# Patient Record
Sex: Female | Born: 1975 | Race: Black or African American | Hispanic: No | Marital: Married | Smoking: Never smoker
Health system: Southern US, Community
[De-identification: ages and names within clinical notes are randomized; demographics above are authoritative.]

## PROBLEM LIST (undated history)

## (undated) DIAGNOSIS — L309 Dermatitis, unspecified: Secondary | ICD-10-CM

## (undated) DIAGNOSIS — Z889 Allergy status to unspecified drugs, medicaments and biological substances status: Secondary | ICD-10-CM

---

## 2015-10-03 ENCOUNTER — Encounter: Payer: Self-pay | Admitting: *Deleted

## 2015-10-03 ENCOUNTER — Emergency Department
Admission: EM | Admit: 2015-10-03 | Discharge: 2015-10-04 | Disposition: A | Discharge status: Home / Self Care | Attending: Emergency Medicine | Admitting: Emergency Medicine

## 2015-10-03 DIAGNOSIS — X58XXXA Exposure to other specified factors, initial encounter: Secondary | ICD-10-CM | POA: Insufficient documentation

## 2015-10-03 DIAGNOSIS — Z880 Allergy status to penicillin: Secondary | ICD-10-CM | POA: Insufficient documentation

## 2015-10-03 DIAGNOSIS — T781XXA Other adverse food reactions, not elsewhere classified, initial encounter: Secondary | ICD-10-CM | POA: Diagnosis not present

## 2015-10-03 DIAGNOSIS — Y9289 Other specified places as the place of occurrence of the external cause: Secondary | ICD-10-CM | POA: Insufficient documentation

## 2015-10-03 DIAGNOSIS — Y9389 Activity, other specified: Secondary | ICD-10-CM | POA: Insufficient documentation

## 2015-10-03 DIAGNOSIS — Y998 Other external cause status: Secondary | ICD-10-CM | POA: Insufficient documentation

## 2015-10-03 DIAGNOSIS — R05 Cough: Secondary | ICD-10-CM | POA: Diagnosis not present

## 2015-10-03 DIAGNOSIS — L500 Allergic urticaria: Secondary | ICD-10-CM | POA: Insufficient documentation

## 2015-10-03 DIAGNOSIS — Z3202 Encounter for pregnancy test, result negative: Secondary | ICD-10-CM | POA: Insufficient documentation

## 2015-10-03 DIAGNOSIS — Z79899 Other long term (current) drug therapy: Secondary | ICD-10-CM | POA: Diagnosis not present

## 2015-10-03 DIAGNOSIS — T7840XA Allergy, unspecified, initial encounter: Secondary | ICD-10-CM

## 2015-10-03 HISTORY — DX: Dermatitis, unspecified: L30.9

## 2015-10-03 LAB — URINALYSIS COMPLETE WITH MICROSCOPIC (ARMC ONLY)
Bilirubin Urine: NEGATIVE
Glucose, UA: NEGATIVE mg/dL
KETONES UR: NEGATIVE mg/dL
Leukocytes, UA: NEGATIVE
NITRITE: NEGATIVE
PH: 7 (ref 5.0–8.0)
PROTEIN: NEGATIVE mg/dL
Specific Gravity, Urine: 1.003 — ABNORMAL LOW (ref 1.005–1.030)
WBC, UA: NONE SEEN WBC/hpf (ref 0–5)

## 2015-10-03 LAB — POCT PREGNANCY, URINE: PREG TEST UR: NEGATIVE

## 2015-10-03 MED ORDER — EPINEPHRINE 0.3 MG/0.3ML IJ SOAJ
0.3000 mg | Freq: Once | INTRAMUSCULAR | Status: AC
  Administered 2015-10-03: 0.3 mg via INTRAMUSCULAR
  Filled 2015-10-03: qty 0.3

## 2015-10-03 MED ORDER — METHYLPREDNISOLONE SODIUM SUCC 125 MG IJ SOLR
125.0000 mg | Freq: Once | INTRAMUSCULAR | Status: AC
  Administered 2015-10-03: 125 mg via INTRAVENOUS
  Filled 2015-10-03: qty 2

## 2015-10-03 MED ORDER — EPINEPHRINE 0.3 MG/0.3ML IJ SOAJ: 0.3000 mg | Freq: Once | INTRAMUSCULAR | Status: AC

## 2015-10-03 MED ORDER — PREDNISONE 20 MG PO TABS: 40.0000 mg | ORAL_TABLET | Freq: Every day | ORAL | Status: DC

## 2015-10-03 MED ORDER — SODIUM CHLORIDE 0.9 % IV BOLUS (SEPSIS)
1000.0000 mL | Freq: Once | INTRAVENOUS | Status: AC
  Administered 2015-10-03: 1000 mL via INTRAVENOUS

## 2015-10-03 NOTE — ED Notes (Signed)
Pt states she ate at [facility], began having difficulty breathing and uncontrolled coughing. Pt states she started having hives at 1900, took benadryl otc 25 mg, repeated benadryl at 1900 when face began swelling and cough persisted. Pt presents w/ continued cough, hives, and angioedema.

## 2015-10-03 NOTE — ED Notes (Addendum)
Pt says she's having an allergic reaction but unsure to what; says she took  Benadryl at 8pm when symptoms started; has worsened since then; swelling around mouth; pt constantly clearing her throat or coughing; says she is having difficulty breathing; charge nurse aware and will take pt to treatment room for triage/assessment

## 2015-10-03 NOTE — Discharge Instructions (Signed)
Please seek medical attention for any high fevers, chest pain, shortness of breath, change in behavior, persistent vomiting, bloody stool or any other new or concerning symptoms. ° ° °Allergies °An allergy is an abnormal reaction to a substance by the body's defense system (immune system). Allergies can develop at any age. °WHAT CAUSES ALLERGIES? °An allergic reaction happens when the immune system mistakenly reacts to a normally harmless substance, called an allergen, as if it were harmful. The immune system releases antibodies to fight the substance. Antibodies eventually release a chemical called histamine into the bloodstream. The release of histamine is meant to protect the body from infection, but it also causes discomfort. °An allergic reaction can be triggered by: °· Eating an allergen. °· Inhaling an allergen. °· Touching an allergen. °WHAT TYPES OF ALLERGIES ARE THERE? °There are many types of allergies. Common types include: °· Seasonal allergies. People with this type of allergy are usually allergic to substances that are only present during certain seasons, such as molds and pollens. °· Food allergies. °· Drug allergies. °· Insect allergies. °· Animal dander allergies. °WHAT ARE SYMPTOMS OF ALLERGIES? °Possible allergy symptoms include: °· Swelling of the lips, face, tongue, mouth, or throat. °· Sneezing, coughing, or wheezing. °· Nasal congestion. °· Tingling in the mouth. °· Rash. °· Itching. °· Itchy, red, swollen areas of skin (hives). °· Watery eyes. °· Vomiting. °· Diarrhea. °· Dizziness. °· Lightheadedness. °· Fainting. °· Trouble breathing or swallowing. °· Chest tightness. °· Rapid heartbeat. °HOW ARE ALLERGIES DIAGNOSED? °Allergies are diagnosed with a medical and family history and one or more of the following: °· Skin tests. °· Blood tests. °· A food diary. A food diary is a record of all the foods and drinks you have in a day and of all the symptoms you experience. °· The results of an  elimination diet. An elimination diet involves eliminating foods from your diet and then adding them back in one by one to find out if a certain food causes an allergic reaction. °HOW ARE ALLERGIES TREATED? °There is no cure for allergies, but allergic reactions can be treated with medicine. Severe reactions usually need to be treated at a hospital. °HOW CAN REACTIONS BE PREVENTED? °The best way to prevent an allergic reaction is by avoiding the substance you are allergic to. Allergy shots and medicines can also help prevent reactions in some cases. People with severe allergic reactions may be able to prevent a life-threatening reaction called anaphylaxis with a medicine given right after exposure to the allergen. °  °This information is not intended to replace advice given to you by your health care provider. Make sure you discuss any questions you have with your health care provider. °  °Document Released: 10/25/2002 Document Revised: 08/22/2014 Document Reviewed: 05/13/2014 °Elsevier Interactive Patient Education ©2016 [employer]. ° °

## 2015-10-04 NOTE — ED Provider Notes (Signed)
-----------------------------------------   2:13 AM on 10/04/2015 -----------------------------------------   Blood pressure 94/45, pulse 76, temperature 98.6 F (37 C), temperature source Oral, resp. rate 16, height  (1.676 m), weight 170 lb (77.111 kg), last menstrual period 09/25/2015, SpO2 100 %.  Assuming care from Dr. Derrill Kay.  In short, Christy Roberts is a 40 y.o. female with a chief complaint of Allergic Reaction .  Refer to the original H&P for additional details.  The current plan of care is to reassess the patient.  The patient was here for 4 hours and she did not have any resurgence of her symptoms. She'll be discharged home to follow-up with her primary care physician.   Rebecka Apley, MD 10/04/15 [phone]

## 2015-10-09 NOTE — ED Provider Notes (Signed)
[facility] Emergency Department Provider Note    ____________________________________________  Time seen: ~2215  I have reviewed the triage vital signs and the nursing notes.   HISTORY  Chief Complaint Allergic Reaction   History limited by: Not Limited   HPI Christy Roberts is a 40 y.o. female who presents to the emergency department today because of concern for allergic reaction. The patient states that she ate lunch at [employer] today. Ever since that time she has felt somewhat off although no symptoms that she clearly took 4 and allergic reaction. She then ate some spaghetti tonight and started feeling like she was having full body itching. She did notice some rash. She additionally felt some upset stomach and had some swelling around her mouth. His symptoms then progressed. She states she has never had symptoms like this before. No known allergies.No new detergents or soaps.     Past Medical History  Diagnosis Date  . Eczema     There are no active problems to display for this patient.   History reviewed. No pertinent past surgical history.  Current Outpatient Rx  Name  Route  Sig  Dispense  Refill  . Multiple Vitamins-Minerals (MULTIVITAMIN ADULT PO)   Oral   Take by mouth.         . Probiotic Product (PROBIOTIC DAILY PO)   Oral   Take by mouth.         . EPINEPHrine (EPIPEN 2-PAK) 0.3 mg/0.3 mL IJ SOAJ injection   Intramuscular   Inject 0.3 mLs (0.3 mg total) into the muscle once.   1 Device   1   . predniSONE (DELTASONE) 20 MG tablet   Oral   Take 2 tablets (40 mg total) by mouth daily.   6 tablet   0     Allergies Penicillins  History reviewed. No pertinent family history.  Social History Social History  Substance Use Topics  . Smoking status: Never Smoker   . Smokeless tobacco: Never Used  . Alcohol Use: No    Review of Systems  Constitutional: Negative for fever. Cardiovascular: Negative for chest  pain. Respiratory: Negative for shortness of breath. Positive for cough Gastrointestinal: Positive for abdominal discomfort Neurological: Negative for headaches, focal weakness or numbness.   10-point ROS otherwise negative.  ____________________________________________   PHYSICAL EXAM:  VITAL SIGNS: ED Triage Vitals  Enc Vitals Group     BP 10/03/15 2221 124/76 mmHg     Pulse Rate 10/03/15 2221 71     Resp 10/03/15 2221 25     Temp 10/03/15 2221 98.6 F (37 C)     Temp Source 10/03/15 2221 Oral     SpO2 10/03/15 2221 97 %     Weight 10/03/15 2221 170 lb (77.111 kg)     Height 10/03/15 2221  (1.676 m)   Constitutional: Alert and oriented. Appears uncomfortable. Occasional dry cough. Eyes: Conjunctivae are normal. PERRL. Normal extraocular movements. ENT   Head: Normocephalic and atraumatic.   Nose: No congestion/rhinnorhea.   Mouth/Throat: Mucous membranes are moist.   Neck: No stridor. Hematological/Lymphatic/Immunilogical: No cervical lymphadenopathy. Cardiovascular: Normal rate, regular rhythm.  No murmurs, rubs, or gallops. Respiratory: Normal respiratory effort without tachypnea nor retractions. Breath sounds are clear and equal bilaterally. No wheezes/rales/rhonchi. Occasional dry cough. Gastrointestinal: Soft and nontender. No distention.  Genitourinary: Deferred Musculoskeletal: Normal range of motion in all extremities. No joint effusions.  No lower extremity tenderness nor edema. Neurologic:  Normal speech and language. No gross focal neurologic  deficits are appreciated.  Skin: Hives noted especially in the antecubital fossa. Psychiatric: Mood and affect are normal. Speech and behavior are normal. Patient exhibits appropriate insight and judgment.  ____________________________________________    LABS (pertinent positives/negatives)  Labs Reviewed  URINALYSIS COMPLETEWITH MICROSCOPIC ([facility] ONLY) - Abnormal; Notable for the following:     Color, Urine COLORLESS (*)    APPearance CLEAR (*)    Specific Gravity, Urine 1.003 (*)    Hgb urine dipstick 1+ (*)    Bacteria, UA RARE (*)    Squamous Epithelial / LPF 0-5 (*)    All other components within normal limits  POCT PREGNANCY, URINE     ____________________________________________   EKG  None  ____________________________________________    RADIOLOGY  None  ____________________________________________   PROCEDURES  Procedure(s) performed: None  Critical Care performed: No  ____________________________________________   INITIAL IMPRESSION / ASSESSMENT AND PLAN / ED COURSE  Pertinent labs & imaging results that were available during my care of the patient were reviewed by me and considered in my medical decision making (see chart for details).  Patient presented to the emergency department today after an apparent allergic reaction. No known allergies. Patient is young and did not have any heart disease so given the respiratory issues I did order epi. Patient felt much better after the epi, the cough and itch had resolved. Will continue to monitor in the emergency department.   ____________________________________________   FINAL CLINICAL IMPRESSION(S) / ED DIAGNOSES  Final diagnoses:  Allergic reaction, initial encounter     Phineas Semen, MD 10/09/15 2229

## 2015-10-20 ENCOUNTER — Ambulatory Visit: Payer: Self-pay | Admitting: Allergy and Immunology

## 2016-04-06 ENCOUNTER — Emergency Department (HOSPITAL_COMMUNITY)
Admission: EM | Admit: 2016-04-06 | Discharge: 2016-04-06 | Disposition: A | Discharge status: Home / Self Care | Attending: Emergency Medicine | Admitting: Emergency Medicine

## 2016-04-06 ENCOUNTER — Encounter (HOSPITAL_COMMUNITY): Payer: Self-pay | Admitting: Emergency Medicine

## 2016-04-06 DIAGNOSIS — N3091 Cystitis, unspecified with hematuria: Secondary | ICD-10-CM

## 2016-04-06 DIAGNOSIS — R300 Dysuria: Secondary | ICD-10-CM

## 2016-04-06 DIAGNOSIS — R319 Hematuria, unspecified: Secondary | ICD-10-CM | POA: Diagnosis present

## 2016-04-06 HISTORY — DX: Allergy status to unspecified drugs, medicaments and biological substances: Z88.9

## 2016-04-06 LAB — URINALYSIS, ROUTINE W REFLEX MICROSCOPIC
Bilirubin Urine: NEGATIVE
GLUCOSE, UA: NEGATIVE mg/dL
Ketones, ur: NEGATIVE mg/dL
Nitrite: NEGATIVE
PH: 7 (ref 5.0–8.0)
Protein, ur: 100 mg/dL — AB
SPECIFIC GRAVITY, URINE: 1.015 (ref 1.005–1.030)

## 2016-04-06 LAB — BASIC METABOLIC PANEL WITH GFR
Anion gap: 4 — ABNORMAL LOW (ref 5–15)
BUN: 8 mg/dL (ref 6–20)
CO2: 28 mmol/L (ref 22–32)
Calcium: 9.3 mg/dL (ref 8.9–10.3)
Chloride: 106 mmol/L (ref 101–111)
Creatinine, Ser: 0.95 mg/dL (ref 0.44–1.00)
GFR calc Af Amer: >60 mL/min (ref >60)
GFR calc non Af Amer: >60 mL/min (ref >60)
Glucose, Bld: 102 mg/dL — ABNORMAL HIGH (ref 65–99)
Potassium: 3.8 mmol/L (ref 3.5–5.1)
Sodium: 138 mmol/L (ref 135–145)

## 2016-04-06 LAB — CBC WITH DIFFERENTIAL/PLATELET
Basophils Absolute: 0 K/uL (ref 0.0–0.1)
Basophils Relative: 0 %
Eosinophils Absolute: 0.1 K/uL (ref 0.0–0.7)
Eosinophils Relative: 1 %
HCT: 36.7 % (ref 36.0–46.0)
Hemoglobin: 12.1 g/dL (ref 12.0–15.0)
Lymphocytes Relative: 20 %
Lymphs Abs: 1.6 K/uL (ref 0.7–4.0)
MCH: 27.8 pg (ref 26.0–34.0)
MCHC: 33 g/dL (ref 30.0–36.0)
MCV: 84.4 fL (ref 78.0–100.0)
Monocytes Absolute: 0.5 K/uL (ref 0.1–1.0)
Monocytes Relative: 7 %
Neutro Abs: 5.8 K/uL (ref 1.7–7.7)
Neutrophils Relative %: 72 %
Platelets: 237 K/uL (ref 150–400)
RBC: 4.35 MIL/uL (ref 3.87–5.11)
RDW: 12 % (ref 11.5–15.5)
WBC: 8 K/uL (ref 4.0–10.5)

## 2016-04-06 LAB — URINE MICROSCOPIC-ADD ON
Bacteria, UA: NONE SEEN
Squamous Epithelial / HPF: NONE SEEN

## 2016-04-06 LAB — PREGNANCY, URINE: PREG TEST UR: NEGATIVE

## 2016-04-06 MED ORDER — SULFAMETHOXAZOLE-TRIMETHOPRIM 800-160 MG PO TABS: 1.0000 | ORAL_TABLET | Freq: Two times a day (BID) | ORAL | 0 refills | Status: AC

## 2016-04-06 MED ORDER — NITROFURANTOIN MONOHYD MACRO 100 MG PO CAPS: 100.0000 mg | ORAL_CAPSULE | Freq: Two times a day (BID) | ORAL | 0 refills | Status: AC

## 2016-04-06 MED ORDER — PHENAZOPYRIDINE HCL 200 MG PO TABS: 200.0000 mg | ORAL_TABLET | Freq: Three times a day (TID) | ORAL | 0 refills | Status: AC

## 2016-04-06 NOTE — ED Notes (Signed)
MD at bedside. 

## 2016-04-06 NOTE — ED Provider Notes (Signed)
[facility]-EMERGENCY DEPT Provider Note   CSN: [id number] Arrival date & time: 04/06/16  0547     History   Chief Complaint Chief Complaint  Patient presents with  . Hematuria    HPI Christy Roberts is a 40 y.o. female with no significant pmhx who presents to the ED today c/o hematuria. Pt states that when she woke up this morning she urinated and noticed blood in the toilet. Initially pt thought that she was on her menstrual cycle but she subsequently developed dysuria, urgency and frequency. Pt is now passing clots in her urine. Pt states she has hx of similar and was diagnosed with a UTI. Pt states that she took Macrobid and her symptoms resolved. She denies any vaginal discharge, abdominal pain, fevers, chills, dyspareunia, vomiting, flank pain.  HPI  Past Medical History:  Diagnosis Date  . Eczema   . H/O seasonal allergies     There are no active problems to display for this patient.   History reviewed. No pertinent surgical history.  OB History    No data available       Home Medications    Prior to Admission medications   Medication Sig Start Date End Date Taking? Authorizing Provider  cetirizine (ZYRTEC) 10 MG tablet Take 10 mg by mouth daily.   Yes Historical Provider, MD  EPINEPHrine (EPIPEN 2-PAK) 0.3 mg/0.3 mL IJ SOAJ injection Inject 0.3 mLs (0.3 mg total) into the muscle once. Patient taking differently: Inject 0.3 mg into the muscle daily as needed (allergic reaction).  10/03/15  Yes Phineas SemenGraydon Goodman, MD  levonorgestrel (MIRENA) 20 MCG/24HR IUD 1 each by Intrauterine route once.   Yes Historical Provider, MD    Family History No family history on file.  Social History Social History  Substance Use Topics  . Smoking status: Never Smoker  . Smokeless tobacco: Never Used  . Alcohol use No     Allergies   Penicillins   Review of Systems Review of Systems  All other systems reviewed and are negative.    Physical Exam Updated Vital Signs BP 105/70  (BP Location: Right Arm)   Pulse (!) 57   Temp 97.9 F (36.6 C) (Oral)   Resp 19   Ht 5\' 6"  (1.676 m)   Wt 81.2 kg   LMP 02/27/2016 (Approximate)   SpO2 100%   BMI 28.89 kg/m   Physical Exam  Constitutional: She is oriented to person, place, and time. She appears well-developed and well-nourished. No distress.  HENT:  Head: Normocephalic and atraumatic.  Eyes: Conjunctivae are normal. Right eye exhibits no discharge. Left eye exhibits no discharge. No scleral icterus.  Cardiovascular: Normal rate.   Pulmonary/Chest: Effort normal.  Abdominal: Soft. Bowel sounds are normal. She exhibits no distension and no mass. There is no tenderness. There is no rebound and no guarding. No hernia.  Neurological: She is alert and oriented to person, place, and time. Coordination normal.  Skin: Skin is warm and dry. No rash noted. She is not diaphoretic. No erythema. No pallor.  Psychiatric: She has a normal mood and affect. Her behavior is normal.  Nursing note and vitals reviewed.    ED Treatments / Results  Labs (all labs ordered are listed, but only abnormal results are displayed) Labs Reviewed  BASIC METABOLIC PANEL - Abnormal; Notable for the following:       Result Value   Glucose, Bld 102 (*)    Anion gap 4 (*)    All other components within normal limits  URINALYSIS, ROUTINE W REFLEX MICROSCOPIC (NOT AT [facility]) - Abnormal; Notable for the following:    APPearance CLOUDY (*)    Hgb urine dipstick LARGE (*)    Protein, ur 100 (*)    Leukocytes, UA SMALL (*)    All other components within normal limits  URINE CULTURE  PREGNANCY, URINE  CBC WITH DIFFERENTIAL/PLATELET  URINE MICROSCOPIC-ADD ON    EKG  EKG Interpretation None       Radiology No results found.  Procedures Procedures (including critical care time)  Medications Ordered in ED Medications - No data to display   Initial Impression / Assessment and Plan / ED Course  I have reviewed the triage vital signs  and the nursing notes.  Pertinent labs & imaging results that were available during my care of the patient were reviewed by me and considered in my medical decision making (see chart for details).  Clinical Course   Otherwise healthy 40 year old female presents to the ED today complaining of hematuria, dysuria, urgency and frequency onset today. Patient reports passing clots in her urine. She has history of similar symptoms and was diagnosed with UTI and treated with Macrobid with complete resolution of her symptoms. UA today does show large hemoglobin and small leukocytes. Does not appear to be grossly infected question whether this might be part of her menstrual cycle. However, given the patient is symptomatic we'll send culture and give prescription for Macrobid per pt request. Patient was given additional prescription for Bactrim and instructed to take this instead if symptoms do not improve as Macrobid will not cover for pyelonephritis. Pt also given Pyridium. Recommend follow up with PCP. Return precautions outlined in patient discharge instructions.    Final Clinical Impressions(s) / ED Diagnoses   Final diagnoses:  Dysuria  Hemorrhagic cystitis    New Prescriptions New Prescriptions   No medications on file     Dub MikesSamantha Tripp Dowless, PA-C 04/06/16 1604    Lyndal Pulleyaniel Knott, MD 04/06/16 1759

## 2016-04-06 NOTE — ED Notes (Signed)
Pt has no question upon discharge

## 2016-04-06 NOTE — ED Triage Notes (Signed)
Pt. reports urinary discomfort/dysuria and hematuria onset this morning , denies injury/no fever or chills.

## 2016-04-06 NOTE — Discharge Instructions (Signed)
Take Macrobid and pyridium as prescribed. If your symptoms do not improve or worsen in any way please begin taking Bactrim. Follow up with your primary care provider for re-evaluation if your symptoms do not improve. Return to the ED if you experience severe abdominal pain, inability to urinate, fevers, chills, loss of consciousness, difficulty breathing.

## 2016-04-08 LAB — URINE CULTURE: Culture: 80000 — AB

## 2016-04-09 ENCOUNTER — Telehealth (HOSPITAL_COMMUNITY): Payer: Self-pay

## 2016-04-09 NOTE — Telephone Encounter (Signed)
Post ED Visit - Positive Culture Follow-up  Culture report reviewed by antimicrobial stewardship pharmacist:  []  Enzo BiNathan Batchelder, Pharm.D. []  Celedonio MiyamotoJeremy Frens, Pharm.D., BCPS []  Garvin FilaMike Maccia, Pharm.D. []  Georgina PillionElizabeth Martin, Pharm.D., BCPS []  BerryvilleMinh Pham, [address] Rainbow BoulevardPharm.D., BCPS, AAHIVP []  Estella HuskMichelle Turner, Pharm.D., BCPS, AAHIVP []  Tennis Mustassie Stewart, Pharm.D. []  Sherle Poeob Vincent, Vermont[place]arm.D. Bernie CoveyX  Taylor Stone, Pharm.D.  Positive urine culture, >/= 100,000 colonies -> E Coli Treated with Nitrofurantoin & Sulfa-Trimeth, organism sensitive to the same and no further patient follow-up is required at this time.   Christy RightClark, Christy Roberts 04/09/2016, 10:36 AM

## 2017-02-24 ENCOUNTER — Emergency Department (HOSPITAL_COMMUNITY)
Admission: EM | Admit: 2017-02-24 | Discharge: 2017-02-24 | Disposition: A | Discharge status: Home / Self Care | Attending: Emergency Medicine | Admitting: Emergency Medicine

## 2017-02-24 ENCOUNTER — Encounter (HOSPITAL_COMMUNITY): Payer: Self-pay

## 2017-02-24 DIAGNOSIS — Z79899 Other long term (current) drug therapy: Secondary | ICD-10-CM | POA: Diagnosis not present

## 2017-02-24 DIAGNOSIS — N6452 Nipple discharge: Secondary | ICD-10-CM

## 2017-02-24 NOTE — Discharge Instructions (Signed)
As discussed, follow up with your primary care provider at your upcoming appointment. Use warm wet compresses and continue taking Doxycycline.   Return sooner if symptoms worsen, pain, swelling, fever, chills or other concerning symptoms.

## 2017-02-24 NOTE — ED Notes (Signed)
The pt is c/o a swollen lt nipple  For one week  She has seen her doctor who gave her a rx for doxycycline.  She has taken that for 7 days.  She has a nipple discharge and there is redness under the nipple  She has no temp  Alert oriented skin warm and dry

## 2017-02-24 NOTE — ED Provider Notes (Signed)
[facility] Provider Note   CSN: [id number] Arrival date & time: 02/24/17  1406     History   Chief Complaint Chief Complaint  Patient presents with  . Breast Discharge    HPI Christy Roberts is a 41 y.o. female presenting with left nipple discharge and swelling. She was seen at urgent care prescribed doxycycline and a mammogram was scheduled. The pain is aggravated by any restrictive clothing. Patient states that she has an appointment on Wednesday with her PCP. She noted that over the last couple of days it has been stretching and swelling a little bit more on the nipple. She was concerned of the risk of developing an abscess. She denies redness, purulent discharge, fever, chills, or any other symptoms.  HPI  Past Medical History:  Diagnosis Date  . Eczema   . H/O seasonal allergies     There are no active problems to display for this patient.   History reviewed. No pertinent surgical history.  OB History    No data available       Home Medications    Prior to Admission medications   Medication Sig Start Date End Date Taking? Authorizing Provider  cetirizine (ZYRTEC) 10 MG tablet Take 10 mg by mouth daily.    [provider]  EPINEPHrine (EPIPEN 2-PAK) 0.3 mg/0.3 mL IJ SOAJ injection Inject 0.3 mLs (0.3 mg total) into the muscle once. Patient taking differently: Inject 0.3 mg into the muscle daily as needed (allergic reaction).  10/03/15   Phineas Semen, MD  levonorgestrel (MIRENA) 20 MCG/24HR IUD 1 each by Intrauterine route once.    [provider]  nitrofurantoin, macrocrystal-monohydrate, (MACROBID) 100 MG capsule Take 1 capsule (100 mg total) by mouth 2 (two) times daily. 04/06/16   Dowless, Lelon Mast Tripp, PA-C  phenazopyridine (PYRIDIUM) 200 MG tablet Take 1 tablet (200 mg total) by mouth 3 (three) times daily. 04/06/16   Dowless, Lester Kinsman, PA-C    Family History No family history on file.  Social History Social History    Substance Use Topics  . Smoking status: Never Smoker  . Smokeless tobacco: Never Used  . Alcohol use No     Allergies   Penicillins   Review of Systems Review of Systems  Constitutional: Negative for chills and fever.  HENT: Negative for ear pain and sore throat.   Eyes: Negative for pain and visual disturbance.  Respiratory: Negative for cough, shortness of breath, wheezing and stridor.   Cardiovascular: Negative for chest pain, palpitations and leg swelling.  Gastrointestinal: Negative for abdominal distention, abdominal pain, diarrhea, nausea and vomiting.  Genitourinary: Negative for dysuria and hematuria.  Musculoskeletal: Negative for arthralgias, back pain, gait problem, joint swelling, myalgias, neck pain and neck stiffness.  Skin: Positive for wound. Negative for color change, pallor and rash.       Patient complains of small area of swelling with white dot on her left nipple  Neurological: Negative for dizziness, seizures, syncope, weakness and headaches.     Physical Exam Updated Vital Signs BP 92/71 (BP Location: Right Arm)   Pulse 69   Temp 98.4 F (36.9 C) (Oral)   Resp 18   LMP 02/13/2017   SpO2 100%   Physical Exam  Constitutional: She appears well-developed and well-nourished. No distress.  Afebrile, nontoxic-appearing, sitting comfortably in bed in no acute distress.  HENT:  Head: Normocephalic and atraumatic.  Eyes: Conjunctivae are normal.  Neck: Normal range of motion. Neck supple.  Cardiovascular: Normal rate, regular rhythm and  normal heart sounds.   No murmur heard. Pulmonary/Chest: Effort normal and breath sounds normal. No respiratory distress. She has no wheezes. She has no rales. She exhibits tenderness.  Small area of swelling the left nipple and tenderness. No evidence of abscess or cellulitis  Abdominal: Soft. She exhibits no distension. There is no tenderness.  Musculoskeletal: Normal range of motion. She exhibits no edema or  deformity.  Neurological: She is alert.  Skin: Skin is warm and dry. She is not diaphoretic.  Psychiatric: She has a normal mood and affect.  Nursing note and vitals reviewed.    ED Treatments / Results  Labs (all labs ordered are listed, but only abnormal results are displayed) Labs Reviewed - No data to display  EKG  EKG Interpretation None       Radiology No results found.  Procedures Procedures (including critical care time)  Medications Ordered in ED Medications - No data to display   Initial Impression / Assessment and Plan / ED Course  I have reviewed the triage vital signs and the nursing notes.  Pertinent labs & imaging results that were available during my care of the patient were reviewed by me and considered in my medical decision making (see chart for details).    Patient presented with swelling and discharge of the left nipple. She was seen on Tuesday at urgent care and prescribed doxycycline she had a few doses. She noted that it didn't appear to be resolving and she is concerned about developing an abscess.  On exam no evidence of cellulitis or abscess amenable to incision and drainage. Patient has a history of nipple discharge which was worked up before by PCP and found to be benign. She noticed some pink iquid discharge after her typical hard discharge and a little bit of swelling. She has been manipulating the nipple in attempts to express it.  Advised patient to apply warm compresses and continue doxycycline until her PCP visit. Patient was given the contact information for the breast center as needed. She is well-appearing, nontoxic and afebrile and stable for discharge.  Discussed strict return precautions and advised to return to the emergency department if experiencing any new or worsening symptoms. Instructions were understood and patient agreed with discharge plan. m  Final Clinical Impressions(s) / ED Diagnoses   Final diagnoses:  Nipple  discharge    New Prescriptions New Prescriptions   No medications on file     Gregary CromerMitchell, Meilani Edmundson B, PA-C 02/24/17 1740    Geoffery Lyonselo, Douglas, MD 02/24/17 2351

## 2017-02-24 NOTE — ED Triage Notes (Signed)
Pt reports left breast discharge x 6 days. [facility]. She states the discharge has went away but now her areola is swollen

## 2017-03-23 ENCOUNTER — Emergency Department (HOSPITAL_COMMUNITY)
Admission: EM | Admit: 2017-03-23 | Discharge: 2017-03-23 | Disposition: A | Discharge status: Home / Self Care | Attending: Emergency Medicine | Admitting: Emergency Medicine

## 2017-03-23 ENCOUNTER — Encounter (HOSPITAL_COMMUNITY): Payer: Self-pay | Admitting: Emergency Medicine

## 2017-03-23 ENCOUNTER — Emergency Department (HOSPITAL_COMMUNITY)

## 2017-03-23 DIAGNOSIS — M25511 Pain in right shoulder: Secondary | ICD-10-CM | POA: Insufficient documentation

## 2017-03-23 DIAGNOSIS — R110 Nausea: Secondary | ICD-10-CM | POA: Insufficient documentation

## 2017-03-23 DIAGNOSIS — R079 Chest pain, unspecified: Secondary | ICD-10-CM | POA: Insufficient documentation

## 2017-03-23 DIAGNOSIS — Z79899 Other long term (current) drug therapy: Secondary | ICD-10-CM | POA: Insufficient documentation

## 2017-03-23 DIAGNOSIS — M79601 Pain in right arm: Secondary | ICD-10-CM

## 2017-03-23 LAB — I-STAT TROPONIN, ED
TROPONIN I, POC: 0 ng/mL (ref 0.00–0.08)
Troponin i, poc: 0 ng/mL (ref 0.00–0.08)

## 2017-03-23 LAB — CBC
HEMATOCRIT: 35.6 % — AB (ref 36.0–46.0)
Hemoglobin: 12.2 g/dL (ref 12.0–15.0)
MCH: 27.9 pg (ref 26.0–34.0)
MCHC: 34.3 g/dL (ref 30.0–36.0)
MCV: 81.3 fL (ref 78.0–100.0)
PLATELETS: 207 10*3/uL (ref 150–400)
RBC: 4.38 MIL/uL (ref 3.87–5.11)
RDW: 11.9 % (ref 11.5–15.5)
WBC: 6.5 10*3/uL (ref 4.0–10.5)

## 2017-03-23 LAB — BASIC METABOLIC PANEL
ANION GAP: 8 (ref 5–15)
BUN: 8 mg/dL (ref 6–20)
CHLORIDE: 103 mmol/L (ref 101–111)
CO2: 28 mmol/L (ref 22–32)
CREATININE: 0.9 mg/dL (ref 0.44–1.00)
Calcium: 9.4 mg/dL (ref 8.9–10.3)
GLUCOSE: 98 mg/dL (ref 65–99)
Potassium: 3.7 mmol/L (ref 3.5–5.1)
Sodium: 139 mmol/L (ref 135–145)

## 2017-03-23 NOTE — ED Provider Notes (Signed)
[facility]-EMERGENCY DEPT Provider Note   CSN: [id number] Arrival date & time: 03/23/17  0049     History   Chief Complaint Chief Complaint  Patient presents with  . Chest Pain    HPI Christy Roberts is a 41 y.o. female.  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 We'll be transitioning to [facility]. Patient with complaint of right shoulder pain radiating to right arm and radiating to and neck and jaw. Pain can be intense at times it is intermittent. Feels like pins and needles. Not made worse by movement of the right arm. Patient is right arm dominant. Associated with nausea. No shortness of breath. This time the pain started around 2300 and has progressively gotten worse. Currently now it's resolved. She's had it happen before and it usually is relieved with Aleve. No history of any neck injury. Patient does not do any sports a predominantly uses her right arm.      Past Medical History:  Diagnosis Date  . Eczema   . H/O seasonal allergies     There are no active problems to display for this patient.   History reviewed. No pertinent surgical history.  OB History    No data available       Home Medications    Prior to Admission medications   Medication Sig Start Date End Date Taking? Authorizing Provider  cetirizine (ZYRTEC) 10 MG tablet Take 10 mg by mouth daily.    [provider]  EPINEPHrine (EPIPEN 2-PAK) 0.3 mg/0.3 mL IJ SOAJ injection Inject 0.3 mLs (0.3 mg total) into the muscle once. Patient taking differently: Inject 0.3 mg into the muscle daily as needed (allergic reaction).  10/03/15   Phineas Semen, MD  levonorgestrel (MIRENA) 20 MCG/24HR IUD 1 each by Intrauterine route once.    [provider]  nitrofurantoin, macrocrystal-monohydrate, (MACROBID) 100 MG capsule Take 1 capsule (100 mg total) by mouth 2 (two) times daily. 04/06/16   Dowless, Lelon Mast Tripp, PA-C  phenazopyridine (PYRIDIUM) 200 MG tablet  Take 1 tablet (200 mg total) by mouth 3 (three) times daily. 04/06/16   Dowless, Lester Kinsman, PA-C    Family History No family history on file.  Social History Social History  Substance Use Topics  . Smoking status: Never Smoker  . Smokeless tobacco: Never Used  . Alcohol use No     Allergies   Penicillins   Review of Systems Review of Systems  Constitutional: Negative for fever.  HENT: Negative for congestion.   Eyes: Negative for redness.  Respiratory: Negative for shortness of breath.   Cardiovascular: Positive for chest pain.  Gastrointestinal: Positive for nausea. Negative for abdominal pain.  Genitourinary: Negative for dysuria.  Musculoskeletal: Positive for neck pain. Negative for back pain and neck stiffness.  Skin: Negative for rash.  Neurological: Negative for headaches.  Hematological: Does not bruise/bleed easily.  Psychiatric/Behavioral: Negative for confusion.     Physical Exam Updated Vital Signs BP 109/77   Pulse (!) 56   Temp 97.7 F (36.5 C) (Oral)   Resp 16   LMP 03/14/2017 (Approximate) Comment: implane irregular periods  SpO2 99%   Physical Exam  Constitutional: She is oriented to person, place, and time. She appears well-developed and well-nourished. No distress.  HENT:  Head: Normocephalic and atraumatic.  Mouth/Throat: Oropharynx is clear and moist.  Eyes: Pupils are equal, round, and reactive to light. EOM are normal.  Neck: Normal range of motion. Neck supple.  Cardiovascular: Normal rate,  regular rhythm and normal heart sounds.   Pulmonary/Chest: Effort normal and breath sounds normal. No respiratory distress.  Abdominal: Soft. Bowel sounds are normal. There is no tenderness.  Musculoskeletal: Normal range of motion. She exhibits no edema or tenderness.  Right arm with full range of motion at wrist elbow and shoulder without eliciting any pain. Radial pulses 2+. Sensory is intact distally. Good strength. No swelling.    Neurological: She is alert and oriented to person, place, and time. No cranial nerve deficit or sensory deficit. She exhibits normal muscle tone. Coordination normal.  Skin: Skin is warm. Capillary refill takes less than 2 seconds.  Nursing note and vitals reviewed.    ED Treatments / Results  Labs (all labs ordered are listed, but only abnormal results are displayed) Labs Reviewed  CBC - Abnormal; Notable for the following:       Result Value   HCT 35.6 (*)    All other components within normal limits  BASIC METABOLIC PANEL  I-STAT TROPONIN, ED  I-STAT TROPONIN, ED    EKG  EKG Interpretation  Date/Time:  Thursday March 23 2017 00:49:46 EDT Ventricular Rate:  72 PR Interval:  174 QRS Duration: 90 QT Interval:  400 QTC Calculation: 438 R Axis:   -38 Text Interpretation:  Normal sinus rhythm Left axis deviation Possible Anterolateral infarct , age undetermined Abnormal ECG No previous ECGs available Confirmed by Vanetta MuldersZackowski, Romey Mathieson [phone]([id number]) on 03/23/2017 9:22:43 AM       Radiology Dg Chest 2 View  Result Date: 03/23/2017 CLINICAL DATA:  Acute onset of right-sided chest pain and right shoulder pain. Initial encounter. EXAM: CHEST  2 VIEW COMPARISON:  None. FINDINGS: The lungs are well-aerated and clear. There is no evidence of focal opacification, pleural effusion or pneumothorax. The heart is normal in size; the mediastinal contour is within normal limits. No acute osseous abnormalities are seen. IMPRESSION: No acute cardiopulmonary process seen. Electronically Signed   By: Roanna RaiderJeffery  Chang M.D.   On: 03/23/2017 01:15    Procedures Procedures (including critical care time)  Medications Ordered in ED Medications - No data to display   Initial Impression / Assessment and Plan / ED Course  I have reviewed the triage vital signs and the nursing notes.  Pertinent labs & imaging results that were available during my care of the patient were reviewed by me and considered in my  medical decision making (see chart for details).     Patient's workup without any acute findings. Chest x-ray negative troponins 2 negative not cardiac in nature. Labs without sniffing abnormality. Symptoms suggestive of perhaps may be a pin in the rotatorcuff injury.  Patient without any symptoms currently. Patient is active duty Hotel managermilitary. [place] Recommend follow-up if there is recurrence of the pain. Patient to return for any new or worse symptoms. Patient clinically stable for discharge home.  Final Clinical Impressions(s) / ED Diagnoses   Final diagnoses:  Diffuse pain in right upper extremity    New Prescriptions New Prescriptions   No medications on file     Vanetta MuldersZackowski, Michal Callicott, MD 03/23/17 1143

## 2017-03-23 NOTE — ED Triage Notes (Addendum)
Patient with right shoulder pain that is radiating to her right arm, feels like pins and needles.  She states that she is also having right sided chest pain with the arm pain, with some nausea.  The pain started at 2300 and has progressively gotten worse. Patient took Aleve with no relief in pain.

## 2017-03-23 NOTE — ED Notes (Signed)
MD at bedside,

## 2017-03-23 NOTE — ED Notes (Signed)
MD at beside

## 2017-03-23 NOTE — Discharge Instructions (Signed)
Workup here in the emergency department without any acute findings. As we discussed suggestive of perhaps maybe pinched nerve. Return for any new or worse symptoms. 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

## 2018-08-01 IMAGING — CR DG CHEST 2V
2 series · 2 of 2 positions shown · non-contrast
Comparison: None.

CLINICAL DATA: Acute onset of right-sided chest pain and right
shoulder pain. Initial encounter.

EXAM:
CHEST  2 VIEW

[chest pa]
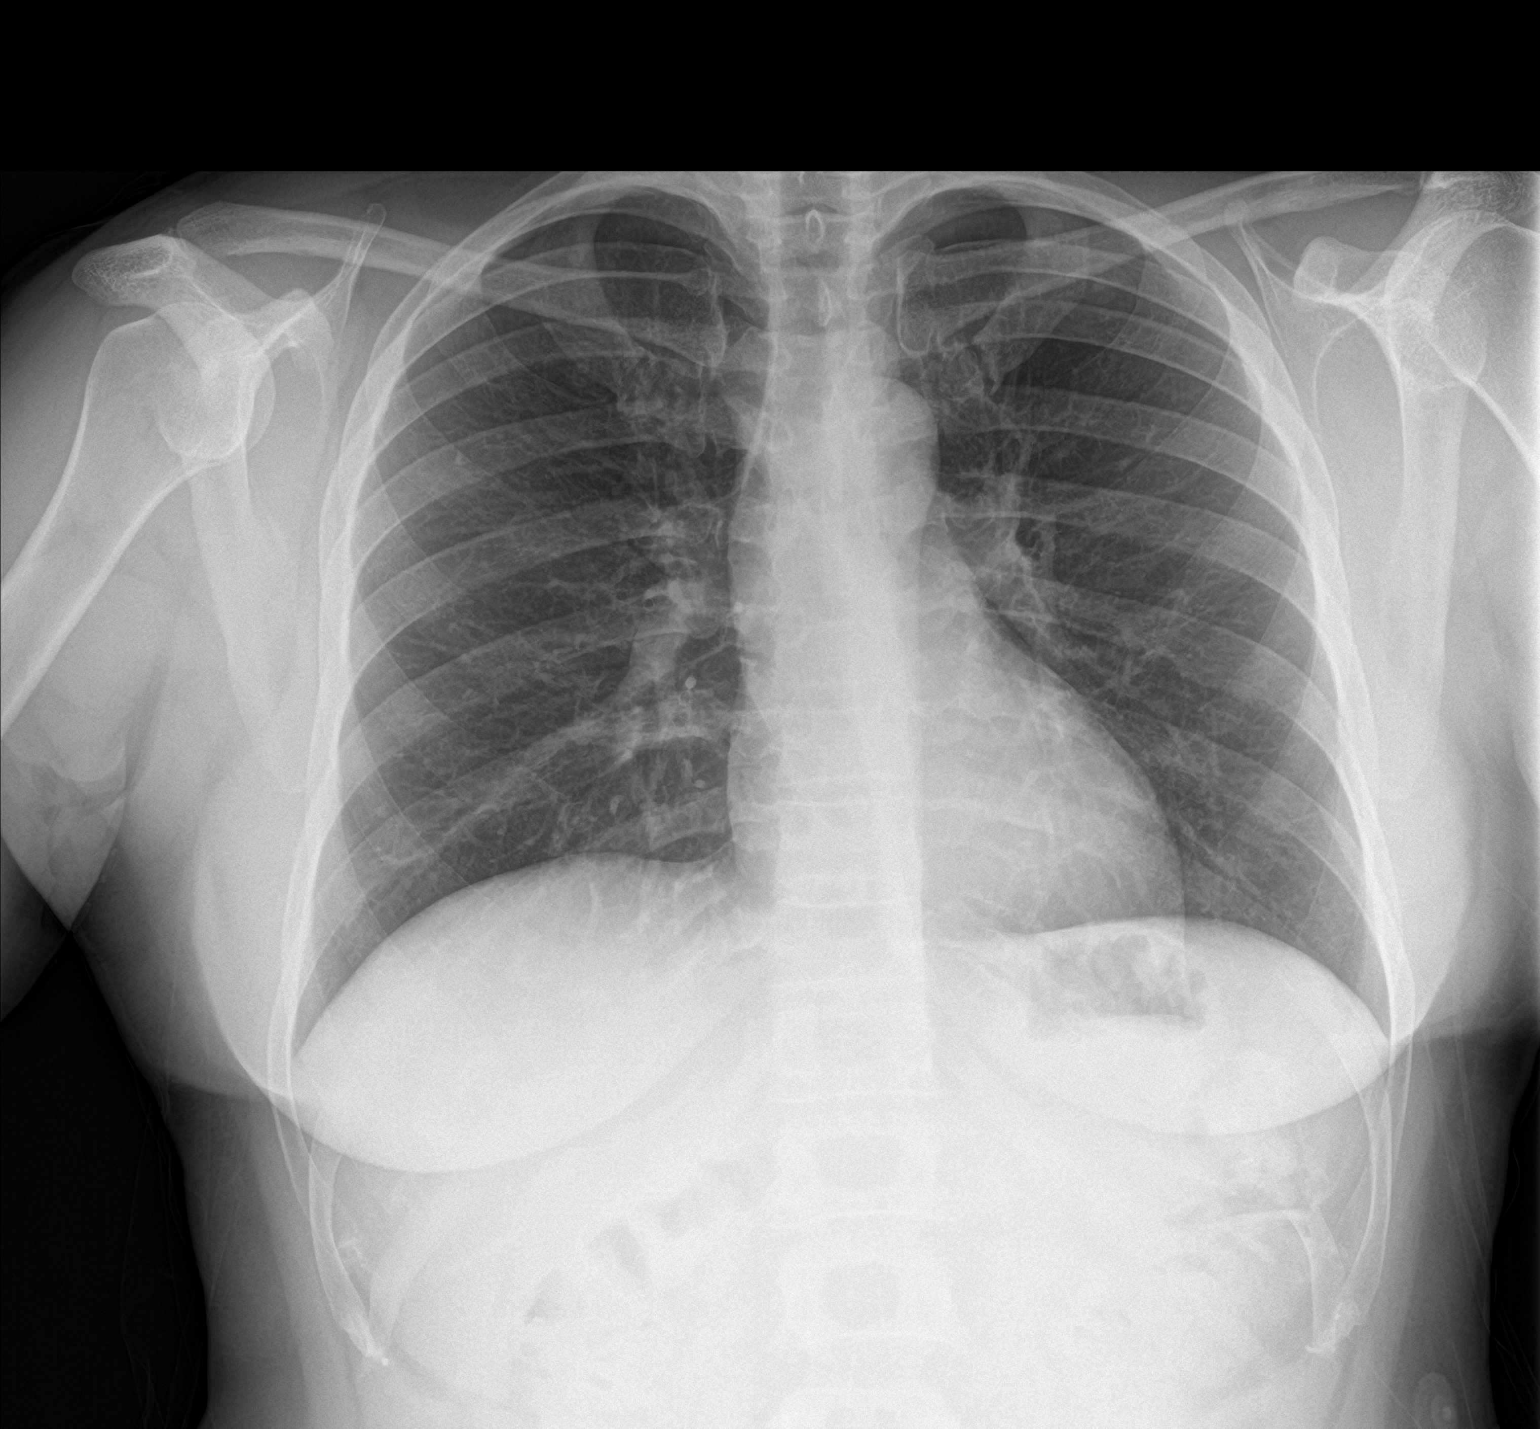

[chest lat]
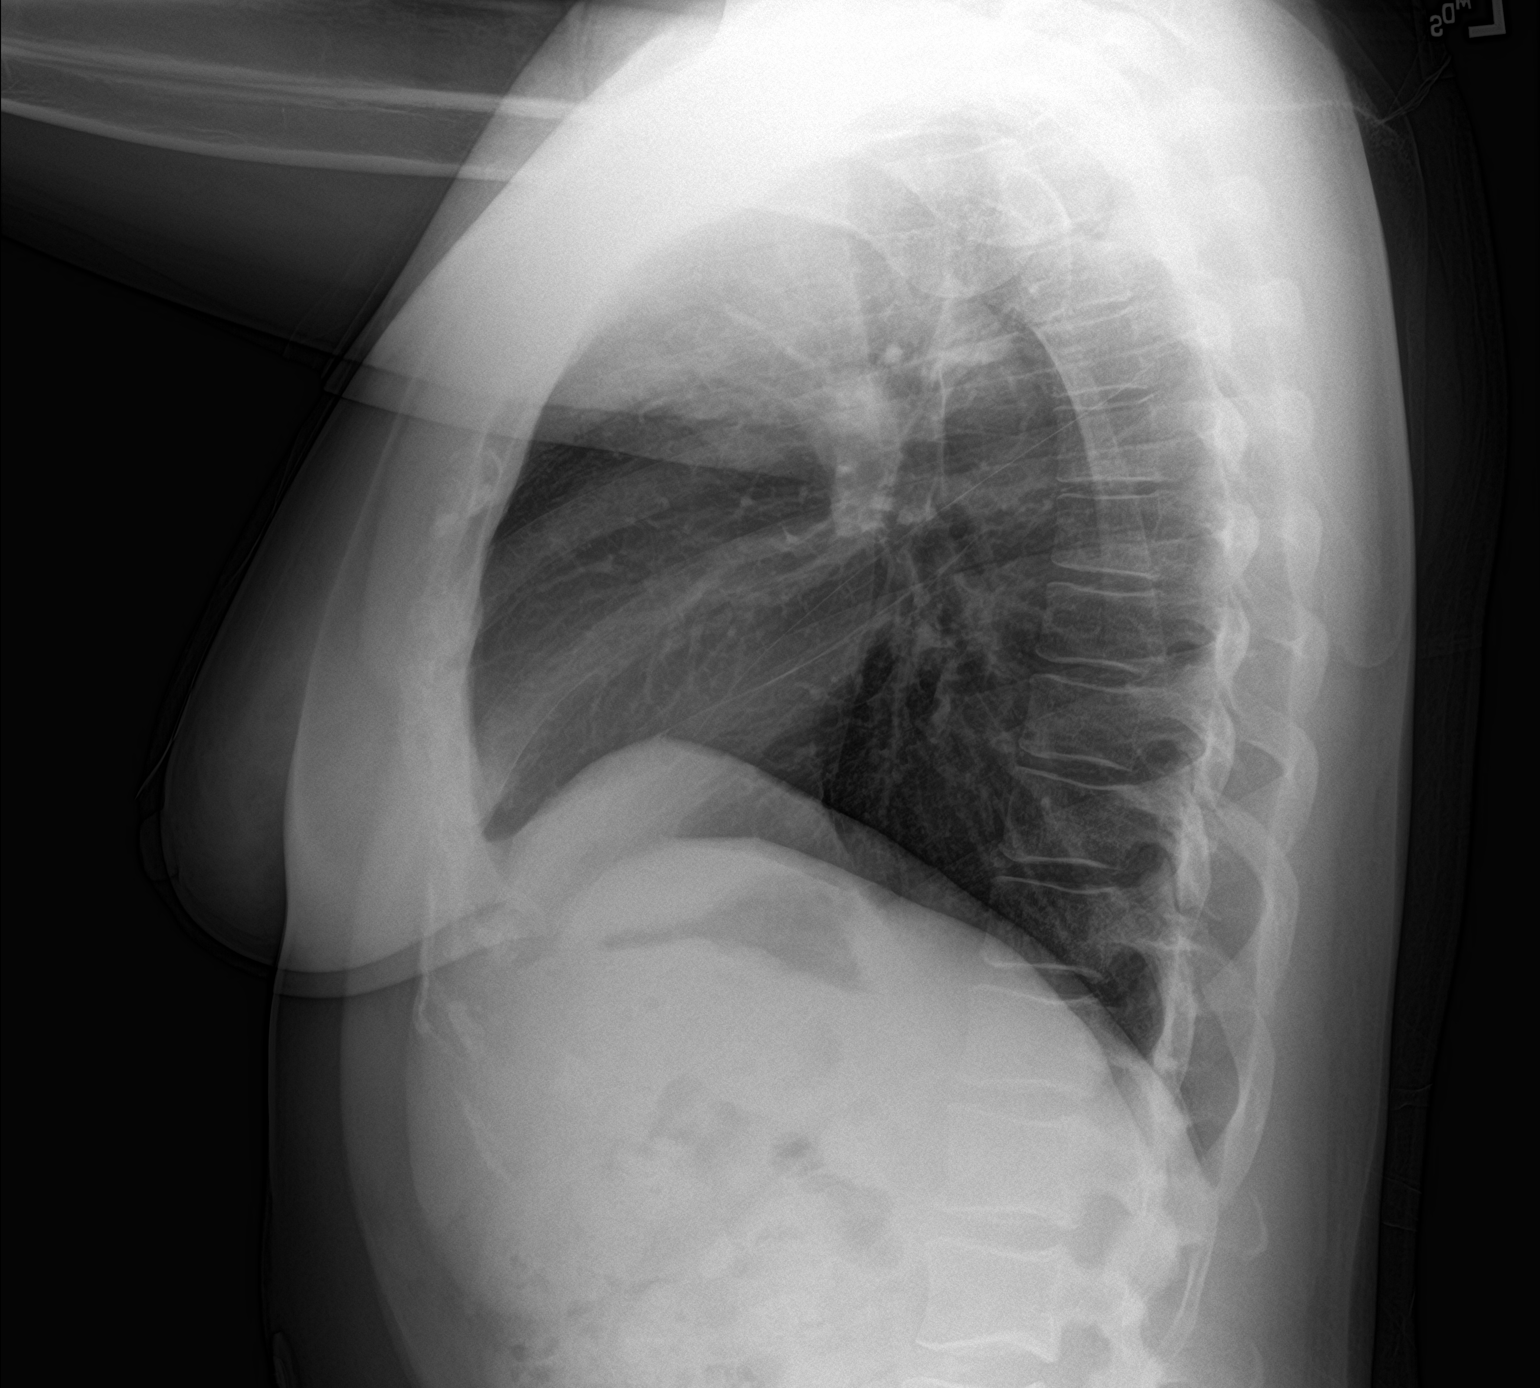

[2 of 2 positions shown; findings below may reference images not displayed]

FINDINGS: The lungs are well-aerated and clear. There is no evidence of focal
opacification, pleural effusion or pneumothorax.

The heart is normal in size; the mediastinal contour is within
normal limits. No acute osseous abnormalities are seen.
IMPRESSION: No acute cardiopulmonary process seen.
